# Patient Record
Sex: Male | Born: 2000 | Race: White | Hispanic: No | Marital: Single | Smoking: Never smoker
Health system: Southern US, Community
[De-identification: ages and names within clinical notes are randomized; demographics above are authoritative.]

## PROBLEM LIST (undated history)

## (undated) DIAGNOSIS — F32A Depression, unspecified: Secondary | ICD-10-CM

## (undated) DIAGNOSIS — F329 Major depressive disorder, single episode, unspecified: Secondary | ICD-10-CM

## (undated) DIAGNOSIS — G479 Sleep disorder, unspecified: Secondary | ICD-10-CM

## (undated) DIAGNOSIS — F952 Tourette's disorder: Secondary | ICD-10-CM

## (undated) DIAGNOSIS — F909 Attention-deficit hyperactivity disorder, unspecified type: Secondary | ICD-10-CM

---
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
## 2022-09-20 ENCOUNTER — Ambulatory Visit: Payer: MEDICAID | Admitting: Family

## 2022-09-20 ENCOUNTER — Encounter: Payer: Self-pay | Admitting: Family

## 2022-09-20 ENCOUNTER — Telehealth: Payer: Self-pay

## 2022-09-20 VITALS — BP 130/78 | HR 78 | Ht 72.0 in | Wt 229.0 lb

## 2022-09-20 DIAGNOSIS — H539 Unspecified visual disturbance: Secondary | ICD-10-CM | POA: Diagnosis not present

## 2022-09-20 DIAGNOSIS — R051 Acute cough: Secondary | ICD-10-CM

## 2022-09-20 NOTE — Progress Notes (Signed)
Established Patient Office Visit  Subjective:  Patient ID: Ryan Osborn, male    DOB: 2001/02/01  Age: 22 y.o. MRN: [id number]  Chief Complaint  Patient presents with   Cough    Coughing up mucus for 1 week or 2 weeks .  The last 2 days patient has had a migraine .    1) Needs antibiotic.  Has had headaches, cough, congestion, etc.  2) Eye appointment. Needs glasses    No other concerns at this time.   Past Medical History:  Diagnosis Date   ADHD (attention deficit hyperactivity disorder)    Depression    Sleep disorder    Tourette disease     No past surgical history on file.  Social History   Socioeconomic History   Marital status: Single    Spouse name: Not on file   Number of children: Not on file   Years of education: Not on file   Highest education level: Not on file  Occupational History   Not on file  Tobacco Use   Smoking status: Never   Smokeless tobacco: Not on file  Substance and Sexual Activity   Alcohol use: No   Drug use: No   Sexual activity: Not on file  Other Topics Concern   Not on file  Social History Narrative   Not on file   Social Determinants of Health   Financial Resource Strain: Not on file  Food Insecurity: Not on file  Transportation Needs: Not on file  Physical Activity: Not on file  Stress: Not on file  Social Connections: Not on file  Intimate Partner Violence: Not on file    No family history on file.  No Known Allergies  Review of Systems  Eyes:  Positive for blurred vision.  Respiratory:  Positive for cough.   All other systems reviewed and are negative.      Objective:   BP 130/78   Pulse 78   Ht 6' (1.829 m)   Wt 229 lb (103.9 kg)   SpO2 98%   BMI 31.06 kg/m   Vitals:   09/20/22 1322  BP: 130/78  Pulse: 78  Height: 6' (1.829 m)  Weight: 229 lb (103.9 kg)  SpO2: 98%  BMI (Calculated): 31.05    Physical Exam Vitals and nursing note reviewed.  Constitutional:      Appearance: Normal  appearance. He is normal weight.  Eyes:     Extraocular Movements: Extraocular movements intact.     Conjunctiva/sclera: Conjunctivae normal.     Pupils: Pupils are equal, round, and reactive to light.  Cardiovascular:     Rate and Rhythm: Normal rate and regular rhythm.     Pulses: Normal pulses.     Heart sounds: Normal heart sounds.  Pulmonary:     Effort: Pulmonary effort is normal.     Breath sounds: Normal breath sounds.  Neurological:     General: No focal deficit present.     Mental Status: He is alert and oriented to person, place, and time. Mental status is at baseline.  Psychiatric:        Mood and Affect: Mood normal.        Behavior: Behavior normal.        Thought Content: Thought content normal.        Judgment: Judgment normal.      Results for orders placed or performed in visit on 09/20/22  POCT XPERT XPRESS SARS COVID-2/FLU/RSV  Result Value Ref Range  SARS Coronavirus 2 neg    FLU A neg    FLU B neg    RSV RNA, PCR neg     Recent Results (from the past 2160 hour(s))  POCT XPERT XPRESS SARS COVID-2/FLU/RSV     Status: None   Collection Time: 09/24/22  8:39 AM  Result Value Ref Range   SARS Coronavirus 2 neg    FLU A neg    FLU B neg    RSV RNA, PCR neg        Assessment & Plan:   Problem List Items Addressed This Visit   None Visit Diagnoses     Acute cough    -  Primary   Relevant Orders   POCT XPERT XPRESS SARS COVID-2/FLU/RSV (Completed)       No follow-ups on file.   Total time spent: 20 minutes  Miki Kins, FNP  09/20/2022   This document may have been prepared by [facility] Voice Recognition software and as such may include unintentional dictation errors.  Marland Kitchen

## 2022-09-21 NOTE — Telephone Encounter (Signed)
Pt verbalized understanding.

## 2022-09-24 LAB — POCT XPERT XPRESS SARS COVID-2/FLU/RSV
FLU A: NEGATIVE
FLU B: NEGATIVE
RSV RNA, PCR: NEGATIVE
SARS Coronavirus 2: NEGATIVE

## 2022-09-30 ENCOUNTER — Encounter: Payer: Self-pay | Admitting: Family

## 2022-09-30 MED ORDER — AMOXICILLIN-POT CLAVULANATE 875-125 MG PO TABS: 1.0000 | ORAL_TABLET | Freq: Two times a day (BID) | ORAL | 0 refills | Status: DC

## 2022-10-22 ENCOUNTER — Other Ambulatory Visit: Payer: Self-pay | Admitting: Family

## 2022-10-22 ENCOUNTER — Telehealth: Payer: Self-pay

## 2022-10-22 MED ORDER — AMOXICILLIN-POT CLAVULANATE 875-125 MG PO TABS: 1.0000 | ORAL_TABLET | Freq: Two times a day (BID) | ORAL | 0 refills | Status: DC

## 2022-10-22 NOTE — Telephone Encounter (Signed)
Kathie Rhodes from [facility] stated that pt has not used them since 2019 so they won't fill the Rx that was sent in.

## 2022-10-23 ENCOUNTER — Other Ambulatory Visit: Payer: Self-pay

## 2022-10-23 MED ORDER — AMOXICILLIN-POT CLAVULANATE 875-125 MG PO TABS: 1.0000 | ORAL_TABLET | Freq: Two times a day (BID) | ORAL | 0 refills | Status: DC

## 2022-10-23 NOTE — Telephone Encounter (Signed)
Debbie from [facility] called concerned because the patient has not used them since 2019. She said he is not getting his abx. Advised her that the pharmacy has been confirmed by patient to send to [facility]. Eunice Blase said it must be their retail location and she will reach out to them.

## 2022-10-23 NOTE — Telephone Encounter (Signed)
Spoke with pt who confirmed that he is using [facility] and have reached out to pharmacy to address clarification and have Rx filled.

## 2023-01-15 ENCOUNTER — Ambulatory Visit: Payer: MEDICAID | Admitting: Family

## 2023-01-15 ENCOUNTER — Encounter: Payer: Self-pay | Admitting: Family

## 2023-01-15 VITALS — BP 130/80 | HR 65 | Ht 72.0 in | Wt 224.0 lb

## 2023-01-15 DIAGNOSIS — R051 Acute cough: Secondary | ICD-10-CM | POA: Diagnosis not present

## 2023-01-15 LAB — POCT XPERT XPRESS SARS COVID-2/FLU/RSV
FLU A: NEGATIVE
FLU B: NEGATIVE
RSV RNA, PCR: NEGATIVE
SARS Coronavirus 2: NEGATIVE

## 2023-01-15 MED ORDER — AMOXICILLIN-POT CLAVULANATE 875-125 MG PO TABS: 1.0000 | ORAL_TABLET | Freq: Two times a day (BID) | ORAL | 0 refills | Status: DC

## 2023-01-15 NOTE — Progress Notes (Unsigned)
   Acute Office Visit  Subjective:     Patient ID: Ryan Osborn, male    DOB: Feb 24, 2001, 22 y.o.   MRN: [id number]  Patient is in today for  Chief Complaint  Patient presents with  . Acute Visit    Sinus pressure and cough    Sinus Problem This is a new problem. The current episode started 1 to 4 weeks ago. The problem has been gradually worsening since onset.     Review of Systems  All other systems reviewed and are negative.       Objective:    BP 130/80   Pulse 65   Ht 6' (1.829 m)   Wt 224 lb (101.6 kg)   SpO2 97%   BMI 30.38 kg/m   Physical Exam Vitals and nursing note reviewed.  Constitutional:      Appearance: Normal appearance. He is normal weight.  Eyes:     Pupils: Pupils are equal, round, and reactive to light.  Cardiovascular:     Rate and Rhythm: Normal rate and regular rhythm.     Pulses: Normal pulses.     Heart sounds: Normal heart sounds.  Pulmonary:     Effort: Pulmonary effort is normal.     Breath sounds: Normal breath sounds.  Neurological:     General: No focal deficit present.     Mental Status: He is alert and oriented to person, place, and time. Mental status is at baseline.  Psychiatric:        Mood and Affect: Mood normal.        Behavior: Behavior normal.    No results found for any visits on 01/15/23.  No results found for this or any previous visit (from the past 2160 hour(s)).     Assessment & Plan:   Problem List Items Addressed This Visit   None Visit Diagnoses     Acute cough    -  Primary   Relevant Orders   POCT XPERT XPRESS SARS COVID-2/FLU/RSV [[id number]]        No follow-ups on file.  Total time spent: 20 minutes  Miki Kins, FNP  01/15/2023   This document may have been prepared by [facility] Voice Recognition software and as such may include unintentional dictation errors.

## 2023-01-16 ENCOUNTER — Encounter: Payer: Self-pay | Admitting: Family

## 2023-02-04 ENCOUNTER — Other Ambulatory Visit: Payer: Self-pay | Admitting: Family

## 2023-02-04 MED ORDER — FEXOFENADINE HCL 180 MG PO TABS: 180.0000 mg | ORAL_TABLET | Freq: Every day | ORAL | 1 refills | Status: AC

## 2023-03-04 ENCOUNTER — Other Ambulatory Visit: Payer: Self-pay | Admitting: Family

## 2023-03-04 MED ORDER — ALBUTEROL SULFATE HFA 108 (90 BASE) MCG/ACT IN AERS: 2.0000 | INHALATION_SPRAY | Freq: Four times a day (QID) | RESPIRATORY_TRACT | 2 refills | Status: DC | PRN

## 2023-03-04 MED ORDER — LEVOFLOXACIN 500 MG PO TABS: 500.0000 mg | ORAL_TABLET | Freq: Every day | ORAL | 0 refills | Status: DC

## 2023-03-04 NOTE — Progress Notes (Signed)
Needs antibiotics, per grandmother

## 2023-03-04 NOTE — Addendum Note (Signed)
Addended by: Grayling Congress on: 03/04/2023 02:27 PM   Modules accepted: Orders

## 2023-03-13 ENCOUNTER — Ambulatory Visit: Payer: MEDICAID | Admitting: Family

## 2023-03-13 ENCOUNTER — Encounter: Payer: Self-pay | Admitting: Family

## 2023-03-13 VITALS — BP 128/78 | HR 71 | Temp 95.8°F | Ht 73.0 in | Wt 230.4 lb

## 2023-03-13 DIAGNOSIS — E782 Mixed hyperlipidemia: Secondary | ICD-10-CM

## 2023-03-13 DIAGNOSIS — E538 Deficiency of other specified B group vitamins: Secondary | ICD-10-CM

## 2023-03-13 DIAGNOSIS — I10 Essential (primary) hypertension: Secondary | ICD-10-CM | POA: Diagnosis not present

## 2023-03-13 DIAGNOSIS — E559 Vitamin D deficiency, unspecified: Secondary | ICD-10-CM | POA: Diagnosis not present

## 2023-03-13 DIAGNOSIS — R5383 Other fatigue: Secondary | ICD-10-CM

## 2023-03-13 DIAGNOSIS — J0140 Acute pansinusitis, unspecified: Secondary | ICD-10-CM

## 2023-03-13 MED ORDER — PREDNISONE 20 MG PO TABS: 40.0000 mg | ORAL_TABLET | Freq: Every day | ORAL | 0 refills | Status: DC

## 2023-03-13 MED ORDER — DOXYCYCLINE HYCLATE 100 MG PO CAPS: 100.0000 mg | ORAL_CAPSULE | Freq: Two times a day (BID) | ORAL | 0 refills | Status: DC

## 2023-03-13 NOTE — Progress Notes (Signed)
 Acute Office Visit  Subjective:     Patient ID: Ryan Osborn, male    DOB: 2000-10-22, 23 y.o.   MRN: [id number]  Patient is in today for  Chief Complaint  Patient presents with   Nasal Congestion    URI  This is a new problem. The current episode started 1 to 4 weeks ago. The problem has been waxing and waning. There has been no fever. Associated symptoms include congestion, coughing, ear pain, rhinorrhea and sinus pain. He has tried acetaminophen , antihistamine, decongestant, eating, increased fluids, sleep, NSAIDs and steam for the symptoms. The treatment provided no relief.     Review of Systems  Constitutional:  Positive for malaise/fatigue. Negative for chills and fever.  HENT:  Positive for congestion, ear pain, rhinorrhea and sinus pain.   Respiratory:  Positive for cough and sputum production.         Objective:    BP 128/78   Pulse 71   Temp (!) 95.8 F (35.4 C) (Tympanic)   Ht 6' 1 (1.854 m)   Wt 230 lb 6.4 oz (104.5 kg)   SpO2 97%   BMI 30.40 kg/m   Physical Exam Vitals and nursing note reviewed.  Constitutional:      Appearance: Normal appearance. He is normal weight.  HENT:     Head: Normocephalic and atraumatic.     Right Ear: Tympanic membrane normal.     Left Ear: Tympanic membrane normal.     Nose: Congestion and rhinorrhea present.  Eyes:     Extraocular Movements: Extraocular movements intact.     Conjunctiva/sclera: Conjunctivae normal.     Pupils: Pupils are equal, round, and reactive to light.  Cardiovascular:     Rate and Rhythm: Normal rate and regular rhythm.     Pulses: Normal pulses.     Heart sounds: Normal heart sounds.  Pulmonary:     Effort: Pulmonary effort is normal.     Breath sounds: Normal breath sounds.  Musculoskeletal:        General: Normal range of motion.     Cervical back: Normal range of motion.  Neurological:     General: No focal deficit present.     Mental Status: He is alert and oriented to person,  place, and time.  Psychiatric:        Mood and Affect: Mood normal.        Behavior: Behavior normal.        Thought Content: Thought content normal.        Judgment: Judgment normal.     Results for orders placed or performed in visit on 03/13/23  Lipid panel  Result Value Ref Range   Cholesterol, Total 119 100 - 199 mg/dL   Triglycerides 59 0 - 149 mg/dL   HDL 38 (L) >60 mg/dL   VLDL Cholesterol Cal 13 5 - 40 mg/dL   LDL Chol Calc (NIH) 68 0 - 99 mg/dL   Chol/HDL Ratio 3.1 0.0 - 5.0 ratio  VITAMIN D  25 Hydroxy (Vit-D Deficiency, Fractures)  Result Value Ref Range   Vit D, 25-Hydroxy 32.6 30.0 - 100.0 ng/mL  CMP14+EGFR  Result Value Ref Range   Glucose 95 70 - 99 mg/dL   BUN 12 6 - 20 mg/dL   Creatinine, Ser 9.01 0.76 - 1.27 mg/dL   eGFR 886 >40 fO/fpw/8.26   BUN/Creatinine Ratio 12 9 - 20   Sodium 143 134 - 144 mmol/L   Potassium 4.7 3.5 - 5.2 mmol/L  Chloride 105 96 - 106 mmol/L   CO2 24 20 - 29 mmol/L   Calcium 9.6 8.7 - 10.2 mg/dL   Total Protein 6.8 6.0 - 8.5 g/dL   Albumin 4.7 4.3 - 5.2 g/dL   Globulin, Total 2.1 1.5 - 4.5 g/dL   Bilirubin Total 0.4 0.0 - 1.2 mg/dL   Alkaline Phosphatase 77 44 - 121 IU/L   AST 16 0 - 40 IU/L   ALT 21 0 - 44 IU/L  Vitamin B12  Result Value Ref Range   Vitamin B-12 417 232 - 1,245 pg/mL  CBC with Diff  Result Value Ref Range   WBC 6.9 3.4 - 10.8 x10E3/uL   RBC 5.25 4.14 - 5.80 x10E6/uL   Hemoglobin 15.6 13.0 - 17.7 g/dL   Hematocrit 53.5 62.4 - 51.0 %   MCV 88 79 - 97 fL   MCH 29.7 26.6 - 33.0 pg   MCHC 33.6 31.5 - 35.7 g/dL   RDW 87.3 88.3 - 84.5 %   Platelets 261 150 - 450 x10E3/uL   Neutrophils 51 Not Estab. %   Lymphs 39 Not Estab. %   Monocytes 7 Not Estab. %   Eos 2 Not Estab. %   Basos 1 Not Estab. %   Neutrophils Absolute 3.5 1.4 - 7.0 x10E3/uL   Lymphocytes Absolute 2.7 0.7 - 3.1 x10E3/uL   Monocytes Absolute 0.5 0.1 - 0.9 x10E3/uL   EOS (ABSOLUTE) 0.1 0.0 - 0.4 x10E3/uL   Basophils Absolute 0.1 0.0 - 0.2  x10E3/uL   Immature Granulocytes 0 Not Estab. %   Immature Grans (Abs) 0.0 0.0 - 0.1 x10E3/uL    Recent Results (from the past 2160 hours)  Lipid panel     Status: Abnormal   Collection Time: 03/13/23 10:41 AM  Result Value Ref Range   Cholesterol, Total 119 100 - 199 mg/dL   Triglycerides 59 0 - 149 mg/dL   HDL 38 (L) >60 mg/dL   VLDL Cholesterol Cal 13 5 - 40 mg/dL   LDL Chol Calc (NIH) 68 0 - 99 mg/dL   Chol/HDL Ratio 3.1 0.0 - 5.0 ratio    Comment:                                   T. Chol/HDL Ratio                                             Men  Women                               1/2 Avg.Risk  3.4    3.3                                   Avg.Risk  5.0    4.4                                2X Avg.Risk  9.6    7.1  3X Avg.Risk 23.4   11.0   VITAMIN D  25 Hydroxy (Vit-D Deficiency, Fractures)     Status: None   Collection Time: 03/13/23 10:41 AM  Result Value Ref Range   Vit D, 25-Hydroxy 32.6 30.0 - 100.0 ng/mL    Comment: Vitamin D  deficiency has been defined by the Institute of Medicine and an Endocrine Society practice guideline as a level of serum 25-OH vitamin D  less than 20 ng/mL (1,2). The Endocrine Society went on to further define vitamin D  insufficiency as a level between 21 and 29 ng/mL (2). 1. IOM (Institute of Medicine). 2010. Dietary reference    intakes for calcium and D. [place]: The    [employer]. 2. Holick MF, Binkley [place], Bischoff-Ferrari HA, et al.    Evaluation, treatment, and prevention of vitamin D     deficiency: an Endocrine Society clinical practice    guideline. JCEM. 2011 Jul; 96(7):1911-30.   CMP14+EGFR     Status: None   Collection Time: 03/13/23 10:41 AM  Result Value Ref Range   Glucose 95 70 - 99 mg/dL   BUN 12 6 - 20 mg/dL   Creatinine, Ser 9.01 0.76 - 1.27 mg/dL   eGFR 886 >40 fO/fpw/8.26   BUN/Creatinine Ratio 12 9 - 20   Sodium 143 134 - 144 mmol/L   Potassium 4.7 3.5 - 5.2 mmol/L    Chloride 105 96 - 106 mmol/L   CO2 24 20 - 29 mmol/L   Calcium 9.6 8.7 - 10.2 mg/dL   Total Protein 6.8 6.0 - 8.5 g/dL   Albumin 4.7 4.3 - 5.2 g/dL   Globulin, Total 2.1 1.5 - 4.5 g/dL   Bilirubin Total 0.4 0.0 - 1.2 mg/dL   Alkaline Phosphatase 77 44 - 121 IU/L   AST 16 0 - 40 IU/L   ALT 21 0 - 44 IU/L  Vitamin B12     Status: None   Collection Time: 03/13/23 10:41 AM  Result Value Ref Range   Vitamin B-12 417 232 - 1,245 pg/mL  CBC with Diff     Status: None   Collection Time: 03/13/23 10:41 AM  Result Value Ref Range   WBC 6.9 3.4 - 10.8 x10E3/uL   RBC 5.25 4.14 - 5.80 x10E6/uL   Hemoglobin 15.6 13.0 - 17.7 g/dL   Hematocrit 53.5 62.4 - 51.0 %   MCV 88 79 - 97 fL   MCH 29.7 26.6 - 33.0 pg   MCHC 33.6 31.5 - 35.7 g/dL   RDW 87.3 88.3 - 84.5 %   Platelets 261 150 - 450 x10E3/uL   Neutrophils 51 Not Estab. %   Lymphs 39 Not Estab. %   Monocytes 7 Not Estab. %   Eos 2 Not Estab. %   Basos 1 Not Estab. %   Neutrophils Absolute 3.5 1.4 - 7.0 x10E3/uL   Lymphocytes Absolute 2.7 0.7 - 3.1 x10E3/uL   Monocytes Absolute 0.5 0.1 - 0.9 x10E3/uL   EOS (ABSOLUTE) 0.1 0.0 - 0.4 x10E3/uL   Basophils Absolute 0.1 0.0 - 0.2 x10E3/uL   Immature Granulocytes 0 Not Estab. %   Immature Grans (Abs) 0.0 0.0 - 0.1 x10E3/uL    Allergies as of 03/13/2023   No Known Allergies      Medication List        Accurate as of March 13, 2023 11:59 PM. If you have any questions, ask your nurse or doctor.          STOP taking these medications  amoxicillin -clavulanate 875-125 MG tablet Commonly known as: AUGMENTIN  Stopped by: ALAN CHRISTELLA ARRANT   levofloxacin  500 MG tablet Commonly known as: LEVAQUIN  Stopped by: ALAN CHRISTELLA ARRANT   lisdexamfetamine 60 MG capsule Commonly known as: VYVANSE  Stopped by: ALAN CHRISTELLA ARRANT       TAKE these medications    albuterol  108 (90 Base) MCG/ACT inhaler Commonly known as: VENTOLIN  HFA Inhale 2 puffs into the lungs every 6 (six) hours as  needed for wheezing or shortness of breath.   doxycycline  100 MG capsule Commonly known as: VIBRAMYCIN  Take 1 capsule (100 mg total) by mouth 2 (two) times daily. Started by: ALAN CHRISTELLA ARRANT   fexofenadine  180 MG tablet Commonly known as: ALLEGRA  Take 1 tablet (180 mg total) by mouth daily.   predniSONE  20 MG tablet Commonly known as: DELTASONE  Take 2 tablets (40 mg total) by mouth daily with breakfast. Started by: ALAN CHRISTELLA ARRANT            Assessment & Plan:   Problem List Items Addressed This Visit   None Visit Diagnoses       B12 deficiency due to diet    -  Primary   Checking labs today.  Will continue supplements as needed.   Relevant Orders   CMP14+EGFR (Completed)   Vitamin B12 (Completed)   CBC with Diff (Completed)     Essential hypertension, benign       Blood pressure well controlled with current medications.  Continue current therapy.  Will reassess at follow up.   Relevant Orders   CMP14+EGFR (Completed)   CBC with Diff (Completed)     Mixed hyperlipidemia       Checking labs today.  Continue current therapy for lipid control. Will modify as needed based on labwork results.   Relevant Orders   Lipid panel (Completed)   CMP14+EGFR (Completed)   CBC with Diff (Completed)     Vitamin D  deficiency, unspecified       Checking labs today.  Will continue supplements as needed.   Relevant Orders   VITAMIN D  25 Hydroxy (Vit-D Deficiency, Fractures) (Completed)   CMP14+EGFR (Completed)   CBC with Diff (Completed)     Other fatigue       Relevant Orders   CMP14+EGFR (Completed)   CBC with Diff (Completed)     Acute non-recurrent pansinusitis       Sending RX for antibiotics and for steroids. Will let me know if he does not improve.        Return if symptoms worsen or fail to improve.  Total time spent: 20 minutes  ALAN CHRISTELLA ARRANT, FNP  03/13/2023   This document may have been prepared by [facility] Voice Recognition software and as such  may include unintentional dictation errors.

## 2023-03-14 LAB — CBC WITH DIFFERENTIAL/PLATELET
Basophils Absolute: 0.1 10*3/uL (ref 0.0–0.2)
Basos: 1 %
EOS (ABSOLUTE): 0.1 10*3/uL (ref 0.0–0.4)
Eos: 2 %
Hematocrit: 46.4 % (ref 37.5–51.0)
Hemoglobin: 15.6 g/dL (ref 13.0–17.7)
Immature Grans (Abs): 0 10*3/uL (ref 0.0–0.1)
Immature Granulocytes: 0 %
Lymphocytes Absolute: 2.7 10*3/uL (ref 0.7–3.1)
Lymphs: 39 %
MCH: 29.7 pg (ref 26.6–33.0)
MCHC: 33.6 g/dL (ref 31.5–35.7)
MCV: 88 fL (ref 79–97)
Monocytes Absolute: 0.5 10*3/uL (ref 0.1–0.9)
Monocytes: 7 %
Neutrophils Absolute: 3.5 10*3/uL (ref 1.4–7.0)
Neutrophils: 51 %
Platelets: 261 10*3/uL (ref 150–450)
RBC: 5.25 x10E6/uL (ref 4.14–5.80)
RDW: 12.6 % (ref 11.6–15.4)
WBC: 6.9 10*3/uL (ref 3.4–10.8)

## 2023-03-14 LAB — CMP14+EGFR
ALT: 21 [IU]/L (ref 0–44)
AST: 16 [IU]/L (ref 0–40)
Albumin: 4.7 g/dL (ref 4.3–5.2)
Alkaline Phosphatase: 77 [IU]/L (ref 44–121)
BUN/Creatinine Ratio: 12 (ref 9–20)
BUN: 12 mg/dL (ref 6–20)
Bilirubin Total: 0.4 mg/dL (ref 0.0–1.2)
CO2: 24 mmol/L (ref 20–29)
Calcium: 9.6 mg/dL (ref 8.7–10.2)
Chloride: 105 mmol/L (ref 96–106)
Creatinine, Ser: 0.98 mg/dL (ref 0.76–1.27)
Globulin, Total: 2.1 g/dL (ref 1.5–4.5)
Glucose: 95 mg/dL (ref 70–99)
Potassium: 4.7 mmol/L (ref 3.5–5.2)
Sodium: 143 mmol/L (ref 134–144)
Total Protein: 6.8 g/dL (ref 6.0–8.5)
eGFR: 113 mL/min/{1.73_m2} (ref >59)

## 2023-03-14 LAB — LIPID PANEL
Chol/HDL Ratio: 3.1 {ratio} (ref 0.0–5.0)
Cholesterol, Total: 119 mg/dL (ref 100–199)
HDL: 38 mg/dL — ABNORMAL LOW (ref >39)
LDL Chol Calc (NIH): 68 mg/dL (ref 0–99)
Triglycerides: 59 mg/dL (ref 0–149)
VLDL Cholesterol Cal: 13 mg/dL (ref 5–40)

## 2023-03-14 LAB — VITAMIN B12: Vitamin B-12: 417 pg/mL (ref 232–1245)

## 2023-03-14 LAB — VITAMIN D 25 HYDROXY (VIT D DEFICIENCY, FRACTURES): Vit D, 25-Hydroxy: 32.6 ng/mL (ref 30.0–100.0)

## 2023-04-23 ENCOUNTER — Encounter: Payer: Self-pay | Admitting: Family

## 2023-04-23 ENCOUNTER — Ambulatory Visit: Payer: MEDICAID | Admitting: Family

## 2023-04-23 VITALS — BP 108/74 | HR 62 | Ht 72.0 in | Wt 222.6 lb

## 2023-04-23 DIAGNOSIS — J0140 Acute pansinusitis, unspecified: Secondary | ICD-10-CM | POA: Diagnosis not present

## 2023-04-23 DIAGNOSIS — Z0130 Encounter for examination of blood pressure without abnormal findings: Secondary | ICD-10-CM

## 2023-04-23 MED ORDER — DOXYCYCLINE HYCLATE 100 MG PO CAPS: 100.0000 mg | ORAL_CAPSULE | Freq: Two times a day (BID) | ORAL | 0 refills | Status: DC

## 2023-04-23 MED ORDER — ALBUTEROL SULFATE HFA 108 (90 BASE) MCG/ACT IN AERS: 2.0000 | INHALATION_SPRAY | Freq: Four times a day (QID) | RESPIRATORY_TRACT | 2 refills | Status: AC | PRN

## 2023-04-23 MED ORDER — ALBUTEROL SULFATE HFA 108 (90 BASE) MCG/ACT IN AERS: 2.0000 | INHALATION_SPRAY | Freq: Four times a day (QID) | RESPIRATORY_TRACT | 2 refills | Status: DC | PRN

## 2023-05-11 ENCOUNTER — Encounter: Payer: Self-pay | Admitting: Family

## 2023-06-30 ENCOUNTER — Encounter: Payer: Self-pay | Admitting: Family

## 2023-06-30 NOTE — Progress Notes (Signed)
 Acute Office Visit  Subjective:     Patient ID: Ryan Osborn, male    DOB: [birth year], 23 y.o.   MRN: [id number]  Patient is in today for  Chief Complaint  Patient presents with   Cough    Cough This is a new problem. The current episode started 1 to 4 weeks ago. The problem has been gradually worsening. The problem occurs every few minutes. The cough is Productive of sputum. Associated symptoms include headaches, nasal congestion and postnasal drip. The symptoms are aggravated by dust, pollens and exercise. Risk factors for lung disease include smoking/tobacco exposure. He has tried steroid inhaler, OTC cough suppressant, oral steroids, rest, cool air and body position changes for the symptoms. The treatment provided mild relief. His past medical history is significant for environmental allergies.     Review of Systems  HENT:  Positive for postnasal drip.   Respiratory:  Positive for cough.   Neurological:  Positive for headaches.  Endo/Heme/Allergies:  Positive for environmental allergies.  All other systems reviewed and are negative.       Objective:    BP 108/74   Pulse 62   Ht 6' (1.829 m)   Wt 222 lb 9.6 oz (101 kg)   SpO2 98%   BMI 30.19 kg/m   Physical Exam Vitals and nursing note reviewed.  Constitutional:      Appearance: Normal appearance. He is normal weight.  HENT:     Head: Normocephalic and atraumatic.     Nose: Congestion and rhinorrhea present.  Eyes:     Pupils: Pupils are equal, round, and reactive to light.  Cardiovascular:     Rate and Rhythm: Normal rate and regular rhythm.     Pulses: Normal pulses.     Heart sounds: Normal heart sounds.  Pulmonary:     Effort: Pulmonary effort is normal.     Breath sounds: Normal breath sounds.  Neurological:     General: No focal deficit present.     Mental Status: He is alert and oriented to person, place, and time.  Psychiatric:        Mood and Affect: Mood normal.        Behavior: Behavior  normal.        Thought Content: Thought content normal.        Judgment: Judgment normal.     No results found for any visits on 04/23/23.  No results found for this or any previous visit (from the past 2160 hours).  Allergies as of 04/23/2023   No Known Allergies      Medication List        Accurate as of April 23, 2023 11:59 PM. If you have any questions, ask your nurse or doctor.          STOP taking these medications    predniSONE  20 MG tablet Commonly known as: DELTASONE  Stopped by: Trenda Frisk       TAKE these medications    albuterol  108 (90 Base) MCG/ACT inhaler Commonly known as: VENTOLIN  HFA Inhale 2 puffs into the lungs every 6 (six) hours as needed for wheezing or shortness of breath.   doxycycline  100 MG capsule Commonly known as: VIBRAMYCIN  Take 1 capsule (100 mg total) by mouth 2 (two) times daily.   fexofenadine  180 MG tablet Commonly known as: ALLEGRA  Take 1 tablet (180 mg total) by mouth daily.            Assessment & Plan:   Problem  List Items Addressed This Visit   None Visit Diagnoses       Acute non-recurrent pansinusitis    -  Primary   Relevant Medications   doxycycline  (VIBRAMYCIN ) 100 MG capsule       Sending antibiotics and albuterol  inhaler for patient.  Will also send RX for fexofenadine  for pt.   Return in about 2 months (around 06/21/2023) for F/U.  Total time spent: 20 minutes  Trenda Frisk, FNP  04/23/2023   This document may have been prepared by [facility] Voice Recognition software and as such may include unintentional dictation errors.

## 2023-07-12 ENCOUNTER — Telehealth: Payer: Self-pay | Admitting: Family

## 2023-07-12 NOTE — Telephone Encounter (Signed)
 Patient left VM asking for something be sent in for a sinus infection. Did not leave any details about his symptoms.  Called patient back and he is having nasal congestion for 3 days, facial pain under the eyes, ear pain off and on.   Please advise.   [facility]

## 2023-07-15 ENCOUNTER — Other Ambulatory Visit: Payer: Self-pay

## 2023-07-15 MED ORDER — AMOXICILLIN-POT CLAVULANATE 875-125 MG PO TABS: 1.0000 | ORAL_TABLET | Freq: Two times a day (BID) | ORAL | 0 refills | Status: DC

## 2023-07-15 MED ORDER — AMOXICILLIN-POT CLAVULANATE 875-125 MG PO TABS: 1.0000 | ORAL_TABLET | Freq: Two times a day (BID) | ORAL | 0 refills | Status: AC

## 2023-07-15 NOTE — Telephone Encounter (Signed)
 Sent augmentin  to the pharmacy and patient has been informed

## 2023-07-30 ENCOUNTER — Ambulatory Visit: Payer: MEDICAID | Admitting: Family

## 2023-08-02 ENCOUNTER — Ambulatory Visit: Payer: MEDICAID | Admitting: Family

## 2023-09-18 ENCOUNTER — Other Ambulatory Visit: Payer: Self-pay | Admitting: Family

## 2023-09-18 MED ORDER — DOXYCYCLINE HYCLATE 100 MG PO CAPS: 100.0000 mg | ORAL_CAPSULE | Freq: Two times a day (BID) | ORAL | 0 refills | Status: AC
